# Patient Record
Sex: Male | Born: 1990 | Race: Black or African American | Hispanic: No | Marital: Single | Smoking: Current every day smoker
Health system: Southern US, Community
[De-identification: ages and names within clinical notes are randomized; demographics above are authoritative.]

## PROBLEM LIST (undated history)

## (undated) DIAGNOSIS — M1990 Unspecified osteoarthritis, unspecified site: Secondary | ICD-10-CM

## (undated) DIAGNOSIS — A64 Unspecified sexually transmitted disease: Secondary | ICD-10-CM

---
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
## 2010-08-04 ENCOUNTER — Emergency Department (HOSPITAL_BASED_OUTPATIENT_CLINIC_OR_DEPARTMENT_OTHER)
Admission: EM | Admit: 2010-08-04 | Discharge: 2010-08-04 | Payer: Self-pay | Source: Home / Self Care | Admitting: Emergency Medicine

## 2011-05-04 LAB — CBC
Hemoglobin: 14.2
RDW: 12.6
WBC: 14.3 — ABNORMAL HIGH

## 2011-05-04 LAB — DIFFERENTIAL
Basophils Absolute: 0
Basophils Relative: 0
Eosinophils Relative: 0
Lymphocytes Relative: 22 — ABNORMAL LOW
Lymphs Abs: 3.1
Monocytes Relative: 9
Neutro Abs: 9.8 — ABNORMAL HIGH
Neutrophils Relative %: 69

## 2011-05-04 LAB — MONONUCLEOSIS SCREEN: Mono Screen: POSITIVE — AB

## 2012-05-08 ENCOUNTER — Emergency Department (HOSPITAL_COMMUNITY)
Admission: EM | Admit: 2012-05-08 | Discharge: 2012-05-09 | Disposition: A | Discharge status: Home / Self Care | Payer: Self-pay | Attending: Emergency Medicine | Admitting: Emergency Medicine

## 2012-05-08 ENCOUNTER — Encounter (HOSPITAL_COMMUNITY): Payer: Self-pay | Admitting: Emergency Medicine

## 2012-05-08 DIAGNOSIS — W57XXXA Bitten or stung by nonvenomous insect and other nonvenomous arthropods, initial encounter: Secondary | ICD-10-CM | POA: Insufficient documentation

## 2012-05-08 DIAGNOSIS — S1096XA Insect bite of unspecified part of neck, initial encounter: Secondary | ICD-10-CM | POA: Insufficient documentation

## 2012-05-08 DIAGNOSIS — Y929 Unspecified place or not applicable: Secondary | ICD-10-CM | POA: Insufficient documentation

## 2012-05-08 NOTE — ED Notes (Signed)
Pt reports got bit by mosquito 2 days ago to R head, no having swelling to R head and pain

## 2012-05-09 MED ORDER — CEPHALEXIN 500 MG PO CAPS: 500.0000 mg | ORAL_CAPSULE | Freq: Four times a day (QID) | ORAL | Status: DC

## 2012-05-09 MED ORDER — IBUPROFEN 600 MG PO TABS: 600.0000 mg | ORAL_TABLET | Freq: Four times a day (QID) | ORAL | Status: DC | PRN

## 2012-05-09 NOTE — ED Provider Notes (Signed)
History     CSN: [id number]  Arrival date & time 05/08/12  2200   First MD Initiated Contact with Patient 05/08/12 2320      Chief Complaint  Patient presents with  . Insect Bite    (Consider location/radiation/quality/duration/timing/severity/associated sxs/prior treatment) HPI  21 year old male presents for evaluation of an insect bite.  Pt report he was bit by an insect 2 days ago on R side of face near R ear.  He felt the bite and did swat at the insect.  He then notice gradual swelling and sore sensation to affected area.  Onset acute, gradual in intensity.  Denies fever, chills, hearing changes, itch, bleeding or rash.  Has not tried any treatment yet.  No hx of allergy.     History reviewed. No pertinent past medical history.  History reviewed. No pertinent past surgical history.  History reviewed. No pertinent family history.  History  Substance Use Topics  . Smoking status: Current Every Day Smoker -- 0.5 packs/day    Types: Cigarettes  . Smokeless tobacco: Not on file  . Alcohol Use: Yes     occasion      Review of Systems  Constitutional: Negative for fever.  HENT: Negative for ear pain, facial swelling, neck pain and ear discharge.   Eyes: Negative for pain.  Skin: Negative for rash.  Neurological: Negative for numbness.    Allergies  Review of patient's allergies indicates no known allergies.  Home Medications  No current outpatient prescriptions on file.  BP 122/73  Pulse 92  Temp 98.5 F (36.9 C) (Oral)  Resp 20  SpO2 98%  Physical Exam  Nursing note and vitals reviewed. Constitutional: He appears well-nourished. No distress.  HENT:  Head: Normocephalic and atraumatic.  Right Ear: External ear normal.  Left Ear: External ear normal.  Nose: Nose normal.  Mouth/Throat: Oropharynx is clear and moist.       A localized skin irritation noted, with mild induration, no fluctuance.  No obvious cellulitis.  ttp  Preauricular lymphadenopathy  noted, ttp.  Eyes: Conjunctivae normal are normal.  Neck: Normal range of motion. Neck supple.  Lymphadenopathy:    He has no cervical adenopathy.  Neurological: He is alert.  Skin: Skin is warm. No rash noted.  Psychiatric: He has a normal mood and affect.    ED Course  Procedures (including critical care time)  Labs Reviewed - No data to display No results found.   No diagnosis found.  1. Localized skin reaction to insect bite  MDM  Pt with localized reaction to insect bite to R side of face anterior to R ear.  Has 1 reactive preauricular lymph node.  Area not amendable to I&D.  Pt request abx.  Will prescribe keflex for simple skin infection with recommendation for wait and treat if redness progress.  Ibuprofen for pain.  Pt voice understanding and agrees with plan.    BP 122/73  Pulse 92  Temp 98.5 F (36.9 C) (Oral)  Resp 20  SpO2 98%  Nursing notes reviewed and considered in documentation        Fayrene Helper, PA-C 05/09/12 0046

## 2012-05-09 NOTE — ED Provider Notes (Signed)
Medical screening examination/treatment/procedure(s) were performed by non-physician practitioner and as supervising physician I was immediately available for consultation/collaboration.    Vida Roller, MD 05/09/12 [phone]

## 2012-05-09 NOTE — Discharge Instructions (Signed)
You may take antibiotic if you notices increased redness and swelling to the affected area.  You may apply neosporin to wound and apply warm compress twice daily as needed to help decrease risk of forming an abscess.  Return if your symptoms worsen or if you have other concerns.   Insect Bite Mosquitoes, flies, fleas, bedbugs, and many other insects can bite. Insect bites are different from insect stings. A sting is when venom is injected into the skin. Some insect bites can transmit infectious diseases. SYMPTOMS  Insect bites usually turn red, swell, and itch for 2 to 4 days. They often go away on their own. TREATMENT  Your caregiver may prescribe antibiotic medicines if a bacterial infection develops in the bite. HOME CARE INSTRUCTIONS  Do not scratch the bite area.  Keep the bite area clean and dry. Wash the bite area thoroughly with soap and water.  Put ice or cool compresses on the bite area.  Put ice in a plastic bag.  Place a towel between your skin and the bag.  Leave the ice on for 20 minutes, 4 times a day for the first 2 to 3 days, or as directed.  You may apply a baking soda paste, cortisone cream, or calamine lotion to the bite area as directed by your caregiver. This can help reduce itching and swelling.  Only take over-the-counter or prescription medicines as directed by your caregiver.  If you are given antibiotics, take them as directed. Finish them even if you start to feel better. You may need a tetanus shot if:  You cannot remember when you had your last tetanus shot.  You have never had a tetanus shot.  The injury broke your skin. If you get a tetanus shot, your arm may swell, get red, and feel warm to the touch. This is common and not a problem. If you need a tetanus shot and you choose not to have one, there is a rare chance of getting tetanus. Sickness from tetanus can be serious. SEEK IMMEDIATE MEDICAL CARE IF:   You have increased pain, redness, or  swelling in the bite area.  You see a red line on the skin coming from the bite.  You have a fever.  You have joint pain.  You have a headache or neck pain.  You have unusual weakness.  You have a rash.  You have chest pain or shortness of breath.  You have abdominal pain, nausea, or vomiting.  You feel unusually tired or sleepy. MAKE SURE YOU:   Understand these instructions.  Will watch your condition.  Will get help right away if you are not doing well or get worse. Document Released: 08/31/2004 Document Revised: 10/16/2011 Document Reviewed: 02/22/2011 [facility] Patient Information 2013 [place], [place].

## 2013-12-03 ENCOUNTER — Encounter (HOSPITAL_COMMUNITY): Payer: Self-pay | Admitting: Emergency Medicine

## 2013-12-03 ENCOUNTER — Emergency Department (HOSPITAL_COMMUNITY)
Admission: EM | Admit: 2013-12-03 | Discharge: 2013-12-03 | Disposition: A | Discharge status: Home / Self Care | Payer: Self-pay | Attending: Emergency Medicine | Admitting: Emergency Medicine

## 2013-12-03 DIAGNOSIS — Z202 Contact with and (suspected) exposure to infections with a predominantly sexual mode of transmission: Secondary | ICD-10-CM | POA: Insufficient documentation

## 2013-12-03 DIAGNOSIS — Z791 Long term (current) use of non-steroidal anti-inflammatories (NSAID): Secondary | ICD-10-CM | POA: Insufficient documentation

## 2013-12-03 DIAGNOSIS — R300 Dysuria: Secondary | ICD-10-CM | POA: Insufficient documentation

## 2013-12-03 DIAGNOSIS — R369 Urethral discharge, unspecified: Secondary | ICD-10-CM | POA: Insufficient documentation

## 2013-12-03 DIAGNOSIS — F17200 Nicotine dependence, unspecified, uncomplicated: Secondary | ICD-10-CM | POA: Insufficient documentation

## 2013-12-03 DIAGNOSIS — Z792 Long term (current) use of antibiotics: Secondary | ICD-10-CM | POA: Insufficient documentation

## 2013-12-03 LAB — RPR

## 2013-12-03 LAB — HIV ANTIBODY (ROUTINE TESTING W REFLEX): HIV: NONREACTIVE

## 2013-12-03 MED ORDER — AZITHROMYCIN 250 MG PO TABS
1000.0000 mg | ORAL_TABLET | Freq: Once | ORAL | Status: AC
  Administered 2013-12-03: 1000 mg via ORAL
  Filled 2013-12-03: qty 4

## 2013-12-03 MED ORDER — CEFTRIAXONE SODIUM 250 MG IJ SOLR
250.0000 mg | Freq: Once | INTRAMUSCULAR | Status: AC
  Administered 2013-12-03: 250 mg via INTRAMUSCULAR
  Filled 2013-12-03: qty 250

## 2013-12-03 MED ORDER — LIDOCAINE HCL (PF) 1 % IJ SOLN
INTRAMUSCULAR | Status: AC
  Administered 2013-12-03: 1 mL
  Filled 2013-12-03: qty 5

## 2013-12-03 NOTE — ED Provider Notes (Signed)
CSN: [id number]     Arrival date & time 12/03/13  1601 History  This chart was scribed for non-physician practitioner Dierdre ForthHannah Evann Erazo, PA-C with Ethelda ChickMartha K Linker, MD by Dorothey Basemania Sutton, ED Scribe. This patient was seen in room TR10C/TR10C and the patient's care was started at 4:49 PM.    Chief Complaint  Patient presents with  . SEXUALLY TRANSMITTED DISEASE   The history is provided by the patient and medical records. No language interpreter was used.   HPI Comments: Donald Michael is a 23 y.o. male who presents to the Emergency Department complaining of penile discharge with associated burning dysuria. Patient reports that his partner was recently diagnosed with chlamydia and gonorrhea. Patient reports that he was treated for both of these diseases prior to his partner's diagnosis.  He denies testicular pain or scrotal swelling, fever, chills, nausea, vomiting, abdominal pain, genital sores. He denies any allergies to medications. Patient has no other pertinent medical history.   History reviewed. No pertinent past medical history. History reviewed. No pertinent past surgical history. History reviewed. No pertinent family history. History  Substance Use Topics  . Smoking status: Current Every Day Smoker -- 0.50 packs/day    Types: Cigarettes  . Smokeless tobacco: Not on file  . Alcohol Use: Yes     Comment: occasion    Review of Systems  Constitutional: Negative for fever and chills.  Gastrointestinal: Negative for nausea, vomiting and abdominal pain.  Genitourinary: Positive for dysuria and discharge. Negative for scrotal swelling, genital sores and testicular pain.  All other systems reviewed and are negative.     Allergies  Review of patient's allergies indicates no known allergies.  Home Medications   Prior to Admission medications   Medication Sig Start Date End Date Taking? Authorizing Provider  cephALEXin (KEFLEX) 500 MG capsule Take 1 capsule (500 mg total) by mouth 4  (four) times daily. 05/09/12   Fayrene HelperBowie Tran, PA-C  ibuprofen (ADVIL,MOTRIN) 600 MG tablet Take 1 tablet (600 mg total) by mouth every 6 (six) hours as needed for pain. 05/09/12   Fayrene HelperBowie Tran, PA-C   Triage Vitals: BP 118/68  Pulse 76  Temp(Src) 98.7 F (37.1 C) (Oral)  Resp 18  SpO2 100%  Physical Exam  Nursing note and vitals reviewed. Constitutional: He appears well-developed and well-nourished. No distress.  Awake, alert, nontoxic appearance  HENT:  Head: Normocephalic and atraumatic.  Mouth/Throat: Oropharynx is clear and moist. No oropharyngeal exudate.  Eyes: Conjunctivae are normal. No scleral icterus.  Neck: Normal range of motion. Neck supple.  Cardiovascular: Normal rate, regular rhythm, normal heart sounds and intact distal pulses.   No murmur heard. Pulmonary/Chest: Effort normal and breath sounds normal. No respiratory distress. He has no wheezes.  Abdominal: Soft. Bowel sounds are normal. He exhibits no distension and no mass. There is no tenderness. There is no rebound and no guarding. Hernia confirmed negative in the right inguinal area and confirmed negative in the left inguinal area.  Genitourinary: Testes normal and penis normal. Cremasteric reflex is present. Right testis shows no mass, no swelling and no tenderness. Right testis is descended. Left testis shows no mass, no swelling and no tenderness. Left testis is descended. Circumcised. No phimosis, paraphimosis, hypospadias, penile erythema or penile tenderness. No discharge found.  Chaperone (scribe) was present for exam which was performed with no discomfort or complications.    Musculoskeletal: Normal range of motion. He exhibits no edema.  Lymphadenopathy:       Right: No inguinal adenopathy present.  Neurological: He is alert.  Speech is clear and goal oriented Moves extremities without ataxia  Skin: Skin is warm and dry. He is not diaphoretic.  Psychiatric: He has a normal mood and affect.    ED Course   Procedures (including critical care time)  DIAGNOSTIC STUDIES: Oxygen Saturation is 100% on room air, normal by my interpretation.    COORDINATION OF CARE: 4:52 PM- Performed STD testing. Will go ahead and treat for gonorrhea and chlamydia. Discussed that he will be notified of his test results in a few days. Discussed treatment plan with patient at bedside and patient verbalized agreement.     Labs Review Labs Reviewed  GC/CHLAMYDIA PROBE AMP  RPR  HIV ANTIBODY (ROUTINE TESTING)    Imaging Review No results found.   EKG Interpretation None      MDM   Final diagnoses:  Exposure to STD   Donald Michael presents after exposure to both Gonorrhea and Chlamydia from a male partner.  STD cultures obtained including gonorrhea, chlamydia, HIV and Syphilis. Patient to be discharged with instructions to follow up with PCP. Discussed importance of using protection when sexually active. Pt understands that they have GC/Chlamydia cultures pending and that they will need to inform all sexual partners if results return positive. Pt treated prophylactically with Azithromycin and Rocephin. I have also discussed reasons to return immediately to the ER including pain in the testicles, difficulty urinating or high fevers. Patient expresses understanding and agrees with plan.  It has been determined that no acute conditions requiring further emergency intervention are present at this time. The patient/guardian have been advised of the diagnosis and plan. We have discussed signs and symptoms that warrant return to the ED, such as changes or worsening in symptoms.   Vital signs are stable at discharge.   BP 118/68  Pulse 76  Temp(Src) 98.7 F (37.1 C) (Oral)  Resp 18  SpO2 100%  Patient/guardian has voiced understanding and agreed to follow-up with the PCP or specialist.    I personally performed the services described in this documentation, which was scribed in my presence. The  recorded information has been reviewed and is accurate.     Dahlia ClientHannah Krishauna Schatzman, PA-C 12/03/13 1704

## 2013-12-03 NOTE — Discharge Instructions (Signed)
1. Medications: usual home medications 2. Treatment: rest, drink plenty of fluids, no sexual intercourse for 7 days after treatmenr 3. Follow Up: Please followup with your primary doctor for discussion of your diagnoses and further evaluation after today's visit; if you do not have a primary care doctor use the resource guide provided to find one;    You have been treated in the emergency department for an infection, possibly sexually transmitted. Results of your gonorrhea and chlamydia tests are pending and you will be notified if they are positive. It is very important to practice safe sex and use condoms when sexually active. If your results are positive you need to notify all sexual partners so they can be treated as well. The website [email or web address].[email or web address]/ can be used to send anonymous text messages or emails to alert sexual contacts. Follow up with your doctor, or OBGYN in regards to today's visit.    Gonorrhea and Chlamydia SYMPTOMS  In females, symptoms may go unnoticed. Symptoms that are more noticeable can include:  Belly (abdominal) pain.  Painful intercourse.  Watery mucous-like discharge from the vagina.  Miscarriage.  Discomfort when urinating.  Inflammation of the rectum.  Abnormal gray-green frothy vaginal discharge  Vaginal itching and irritatio  Itching and irritation of the area outside the vagina.   Painful urination.  Bleeding after sexual intercourse.  In males, symptoms include:  Burning with urination.  Pain in the testicles.  Watery mucous-like discharge from the penis.  It can cause longstanding (chronic) pelvic pain after frequent infections.  TREATMENT  PID can cause women to not be able to have children (sterile) if left untreated or if half-treated.  It is important to finish ALL medications given to you.  This is a sexually transmitted infection. So you are also at risk for other sexually transmitted diseases, including HIV (AIDS), it is recommended  that you get tested. HOME CARE INSTRUCTIONS  Warning: This infection is contagious. Do not have sex until treatment is completed. Follow up at your caregiver's office or the clinic to which you were referred. If your diagnosis (learning what is wrong) is confirmed by culture or some other method, your recent sexual contacts need treatment. Even if they are symptom free or have a negative culture or evaluation, they should be treated.  PREVENTION  Women should use sanitary pads instead of tampons for vaginal discharge.  Wipe front to back after using the toilet and avoid douching.   Practice safe sex, use condoms, have only one sex partner and be sure your sex partner is not having sex with others.  Ask your caregiver to test you for chlamydia at your regular checkups or sooner if you are having symptoms.  Ask for further information if you are pregnant.  SEEK IMMEDIATE MEDICAL CARE IF:  You develop an oral temperature above 102 F (38.9 C), not controlled by medications or lasting more than 2 days.  You develop an increase in pain.  You develop any type of abnormal discharge.  You develop vaginal bleeding and it is not time for your period.  You develop painful intercourse.   Bacterial Vaginosis  Bacterial vaginosis (BV) is a vaginal infection where the normal balance of bacteria in the vagina is disrupted. This is not a sexually transmitted disease and your sexual partners do NOT need to be treated. CAUSES  The cause of BV is not fully understood. BV develops when there is an increase or imbalance of harmful bacteria.  Some activities or behaviors can upset  the normal balance of bacteria in the vagina and put women at increased risk including:  Having a new sex partner or multiple sex partners.  Douching.  Using an intrauterine device (IUD) for contraception.  It is not clear what role sexual activity plays in the development of BV. However, women that have never had sexual intercourse are  rarely infected with BV.  Women do not get BV from toilet seats, bedding, swimming pools or from touching objects around them.   SYMPTOMS  Grey vaginal discharge.  A fish-like odor with discharge, especially after sexual intercourse.  Itching or burning of the vagina and vulva.  Burning or pain with urination.  Some women have no signs or symptoms at all.   TREATMENT  Sometimes BV will clear up without treatment.  BV may be treated with antibiotics.  BV can recur after treatment. If this happens, a second round of antibiotics will often be prescribed.  HOME CARE INSTRUCTIONS  Finish all medication as directed by your caregiver.  Do not have sex until treatment is completed.  Do NOT drink any alcoholic beverages while being treated  with Metronidazole (Flagyl). This will cause a severe reaction inducing vomiting.  RESOURCE GUIDE  Dental Problems  Patients with Medicaid: [facility]                     [facility] [phone]                                           [phone] Phone:  [phone]                                                  Phone:  [phone]  If unable to pay or uninsured, contact:  [facility] or [facility] to become qualified for the adult dental clinic.  Chronic Pain Problems Contact [facility]  [phone] Patients need to be referred by their primary care doctor.  Insufficient Money for Medicine Contact United Way:  call "211" or [facility] [phone].  No Primary Care Doctor Call [facility]  [phone] Other agencies that provide inexpensive medical care    [facility]  [phone]    [facility]  [phone]    [facility]  [phone]    [facility]  [phone]    [facility]  [phone]    [facility]  [phone]  Psychological Services [facility]  [phone] [facility]  [phone] [facility]  [facility]   [phone] (emergency services [phone])  Substance Abuse Resources [facility]  [phone] [facility] [phone] [facility] [phone] [facility] [phone] Residential & Outpatient Substance Abuse Program  [phone]  Abuse/Neglect [facility] Child [facility] Hotline [phone] [facility] Hotline [phone] (After Hours)  Emergency Shelter [facility] [phone](336) [phone]  [facility] [phone]) [phone] [facility] [phone]  MRSA Hotline #:   604-705-0454815-617-7571    [facility]  [facility]     [employer]  [facility] °[address] [place]                       [address]      [address]  °[place]                                                [place]                            [place] °Phone:  [phone]                                   Phone:  [phone]                 Phone:  [phone] ° °[facility] °Phone:  342-8316 ° °[place] Child Abuse Hotline °(336) 342-1394 °([phone]) [phone] (After Hours) ° ° ° ° °

## 2013-12-03 NOTE — ED Notes (Signed)
Pt presents to department for evaluation of possible STD. Pt states penile discharge and pain with urination. States partner was exposed and wants to be tested. No signs of distress noted.

## 2013-12-03 NOTE — ED Provider Notes (Signed)
Medical screening examination/treatment/procedure(s) were performed by non-physician practitioner and as supervising physician I was immediately available for consultation/collaboration.   EKG Interpretation None       Ethelda ChickMartha K Linker, MD 12/03/13 1705

## 2013-12-04 LAB — GC/CHLAMYDIA PROBE AMP
CT PROBE, AMP APTIMA: POSITIVE — AB
GC PROBE AMP APTIMA: POSITIVE — AB

## 2013-12-05 ENCOUNTER — Telehealth (HOSPITAL_BASED_OUTPATIENT_CLINIC_OR_DEPARTMENT_OTHER): Payer: Self-pay | Admitting: Emergency Medicine

## 2013-12-05 NOTE — Telephone Encounter (Addendum)
+  Gonorrhea. +Chlamydia. Patient treated with Rocephin and Zithromax. [employer] faxed.

## 2013-12-05 NOTE — Telephone Encounter (Signed)
Patient notified of +Gonorrhea and +Chlamydia and appropriate treatment.

## 2014-03-15 ENCOUNTER — Emergency Department (HOSPITAL_COMMUNITY)
Admission: EM | Admit: 2014-03-15 | Discharge: 2014-03-15 | Disposition: A | Discharge status: Home / Self Care | Payer: Self-pay | Attending: Emergency Medicine | Admitting: Emergency Medicine

## 2014-03-15 ENCOUNTER — Encounter (HOSPITAL_COMMUNITY): Payer: Self-pay | Admitting: Emergency Medicine

## 2014-03-15 DIAGNOSIS — F17200 Nicotine dependence, unspecified, uncomplicated: Secondary | ICD-10-CM | POA: Insufficient documentation

## 2014-03-15 DIAGNOSIS — Z202 Contact with and (suspected) exposure to infections with a predominantly sexual mode of transmission: Secondary | ICD-10-CM | POA: Insufficient documentation

## 2014-03-15 DIAGNOSIS — Z8619 Personal history of other infectious and parasitic diseases: Secondary | ICD-10-CM | POA: Insufficient documentation

## 2014-03-15 DIAGNOSIS — R369 Urethral discharge, unspecified: Secondary | ICD-10-CM | POA: Insufficient documentation

## 2014-03-15 HISTORY — DX: Unspecified sexually transmitted disease: A64

## 2014-03-15 MED ORDER — AZITHROMYCIN 250 MG PO TABS
1000.0000 mg | ORAL_TABLET | Freq: Once | ORAL | Status: AC
  Administered 2014-03-15: 1000 mg via ORAL
  Filled 2014-03-15: qty 4

## 2014-03-15 MED ORDER — CEFTRIAXONE SODIUM 250 MG IJ SOLR
250.0000 mg | Freq: Once | INTRAMUSCULAR | Status: AC
  Administered 2014-03-15: 250 mg via INTRAMUSCULAR
  Filled 2014-03-15: qty 250

## 2014-03-15 MED ORDER — LIDOCAINE HCL (PF) 1 % IJ SOLN
5.0000 mL | Freq: Once | INTRAMUSCULAR | Status: AC
  Administered 2014-03-15: 5 mL
  Filled 2014-03-15: qty 5

## 2014-03-15 NOTE — Discharge Instructions (Signed)
Refrain from sexual intercourse for 7 days. Be sure to have all partners tested and treated for STDs. You may also be tested and treated by a primary care provider as well as the Health Department. Practice safe sex by always wearing condoms.

## 2014-03-15 NOTE — ED Notes (Signed)
Declined W/C at D/C and was escorted to lobby by RN. 

## 2014-03-15 NOTE — ED Provider Notes (Signed)
CSN: [id number]     Arrival date & time 03/15/14  0912 History  This chart was scribed for Junius FinnerErin O'Malley, PA-C, working with Raeford RazorStephen Kohut, MD by Chestine SporeSoijett Blue, ED Scribe. The patient was seen in room TR06C/TR06C at 9:28 AM.     Chief Complaint  Patient presents with  . Exposure to STD     The history is provided by the patient. No language interpreter was used.   HPI Comments: Donald Michael is a 23 y.o. male who presents to the Emergency Department complaining of an exposure to STD. He states that his "baby m[facility]" [facility] and was dx with gonorrhea and trichomoniasis. He states that he is having associated symptoms of tingling, genital pain, and penile discharge. He denies nausea, vomiting, fever, abdominal pain, rashes, and any other associated symptoms. He denies being allergic to any medications.   Past Medical History  Diagnosis Date  . STD (male)    History reviewed. No pertinent past surgical history. History reviewed. No pertinent family history. History  Substance Use Topics  . Smoking status: Current Every Day Smoker -- 0.50 packs/day    Types: Cigarettes  . Smokeless tobacco: Not on file  . Alcohol Use: Yes     Comment: occasion    Review of Systems  Constitutional: Negative for fever.  Gastrointestinal: Negative for nausea, vomiting and abdominal distention.  Genitourinary: Positive for discharge and penile pain.       Tingling feeling in penis  Skin: Negative for rash.  All other systems reviewed and are negative.     Allergies  Review of patient's allergies indicates no known allergies.  Home Medications   Prior to Admission medications   Not on File   BP 110/66  Pulse 58  Temp(Src) 98.4 F (36.9 C) (Oral)  Resp 18  Ht 6\' 4"  (1.93 m)  Wt 176 lb (79.833 kg)  BMI 21.43 kg/m2  SpO2 100%  Physical Exam  Nursing note and vitals reviewed. Constitutional: He is oriented to person, place, and time. He appears well-developed and  well-nourished.  HENT:  Head: Normocephalic and atraumatic.  Eyes: EOM are normal.  Neck: Normal range of motion.  Cardiovascular: Normal rate.   Pulmonary/Chest: Effort normal.  Abdominal: Soft. He exhibits no distension. There is no tenderness.  Genitourinary:  Chaperone during exam. No penile discharge, rash, or erythema. No scrotal swelling.   Musculoskeletal: Normal range of motion.  Neurological: He is alert and oriented to person, place, and time.  Skin: Skin is warm and dry.  Psychiatric: He has a normal mood and affect. His behavior is normal.    ED Course  Procedures (including critical care time) DIAGNOSTIC STUDIES: Oxygen Saturation is 100% on room air, normal by my interpretation.    COORDINATION OF CARE: 9:31 AM-Discussed treatment plan which includes Zithromax, Rocephin, and GC/Chlamydia probe labs with pt at bedside and pt agreed to plan.   Labs Review Labs Reviewed  GC/CHLAMYDIA PROBE AMP    Imaging Review No results found.   EKG Interpretation None      MDM   Final diagnoses:  Exposure to STD  Penile discharge    Pt presenting to ED with exposure to STD with associated penile discharge and pain. No rash or lesion. No fever, n/v/d. GC/Chlamydia swab sent. Pt tx empirically with azithromycin and rocephin.  Medical records reviewed, negative HIV and syphilis labs in 11/2013.  Did not repeat today. Advised to f/u with Health department in 7 days for recheck  as well as have his partner properly treated and rechecked. Encouraged use of condoms. Pt education packet sent home with pt. Pt verbalized understanding and agreement with tx plan.   I personally performed the services described in this documentation, which was scribed in my presence. The recorded information has been reviewed and is accurate.    Junius Finner, PA-C 03/15/14 1015

## 2014-03-15 NOTE — ED Notes (Signed)
Pt reports partner has a DX of STD and is here for treatment.

## 2014-03-17 LAB — GC/CHLAMYDIA PROBE AMP
CT Probe RNA: NEGATIVE
GC Probe RNA: POSITIVE — AB

## 2014-03-18 ENCOUNTER — Telehealth (HOSPITAL_BASED_OUTPATIENT_CLINIC_OR_DEPARTMENT_OTHER): Payer: Self-pay | Admitting: Emergency Medicine

## 2014-03-18 NOTE — Telephone Encounter (Signed)
Post ED Visit - Positive Culture Follow-up  Culture report reviewed by antimicrobial stewardship pharmacist: []  Wes Dulaney, Pharm.D., BCPS []  Celedonio MiyamotoJeremy Frens, Pharm.D., BCPS []  Georgina PillionElizabeth Martin, Pharm.D., BCPS []  [place] City[place]h Pham, Vermont[place]arm.D., BCPS, AAHIVP []  Estella HuskMichelle Turner, Pharm.D., BCPS, AAHIVP []  Red ChristiansSamson Lee, Pharm.D. []  Tennis Mustassie Stewart, Pharm.D.  Positive gonorrhea culture Treated with zithromax 1000mg  po, rocephin 250mg  IM , organism sensitive to the same and no further patient follow-up is required at this time.  Berle MullMiller, Dayveon Halley 03/18/2014, 11:35 AM

## 2014-03-19 ENCOUNTER — Telehealth (HOSPITAL_BASED_OUTPATIENT_CLINIC_OR_DEPARTMENT_OTHER): Payer: Self-pay | Admitting: Emergency Medicine

## 2014-03-19 NOTE — ED Provider Notes (Signed)
Medical screening examination/treatment/procedure(s) were performed by non-physician practitioner and as supervising physician I was immediately available for consultation/collaboration.   EKG Interpretation None       Aesha Agrawal, MD 03/19/14 0627 

## 2014-03-20 ENCOUNTER — Telehealth (HOSPITAL_BASED_OUTPATIENT_CLINIC_OR_DEPARTMENT_OTHER): Payer: Self-pay | Admitting: Emergency Medicine

## 2014-05-13 ENCOUNTER — Encounter (HOSPITAL_COMMUNITY): Payer: Self-pay | Admitting: Emergency Medicine

## 2014-05-13 ENCOUNTER — Emergency Department (HOSPITAL_COMMUNITY): Payer: Self-pay

## 2014-05-13 ENCOUNTER — Emergency Department (HOSPITAL_COMMUNITY)
Admission: EM | Admit: 2014-05-13 | Discharge: 2014-05-13 | Disposition: A | Discharge status: Home / Self Care | Payer: Self-pay | Attending: Emergency Medicine | Admitting: Emergency Medicine

## 2014-05-13 DIAGNOSIS — R000 Tachycardia, unspecified: Secondary | ICD-10-CM | POA: Insufficient documentation

## 2014-05-13 DIAGNOSIS — S0101XA Laceration without foreign body of scalp, initial encounter: Secondary | ICD-10-CM | POA: Insufficient documentation

## 2014-05-13 DIAGNOSIS — Z23 Encounter for immunization: Secondary | ICD-10-CM | POA: Insufficient documentation

## 2014-05-13 DIAGNOSIS — Z8619 Personal history of other infectious and parasitic diseases: Secondary | ICD-10-CM | POA: Insufficient documentation

## 2014-05-13 DIAGNOSIS — Z720 Tobacco use: Secondary | ICD-10-CM | POA: Insufficient documentation

## 2014-05-13 MED ORDER — HYDROCODONE-ACETAMINOPHEN 5-325 MG PO TABS
2.0000 | ORAL_TABLET | Freq: Once | ORAL | Status: AC
  Administered 2014-05-13: 2 via ORAL
  Filled 2014-05-13: qty 2

## 2014-05-13 MED ORDER — HYDROCODONE-ACETAMINOPHEN 5-325 MG PO TABS: 1.0000 | ORAL_TABLET | Freq: Four times a day (QID) | ORAL | Status: AC | PRN

## 2014-05-13 MED ORDER — TETANUS-DIPHTH-ACELL PERTUSSIS 5-2.5-18.5 LF-MCG/0.5 IM SUSP
0.5000 mL | Freq: Once | INTRAMUSCULAR | Status: AC
  Administered 2014-05-13: 0.5 mL via INTRAMUSCULAR
  Filled 2014-05-13: qty 0.5

## 2014-05-13 NOTE — ED Notes (Signed)
Dr.Walden at the bedside.  

## 2014-05-13 NOTE — ED Notes (Signed)
Patient transported to CT 
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
## 2014-05-13 NOTE — Discharge Instructions (Signed)
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
## 2014-05-13 NOTE — ED Notes (Signed)
CSI at the bedside 

## 2014-09-15 ENCOUNTER — Emergency Department (HOSPITAL_COMMUNITY)
Admission: EM | Admit: 2014-09-15 | Discharge: 2014-09-15 | Disposition: A | Discharge status: Home / Self Care | Payer: Self-pay | Attending: Emergency Medicine | Admitting: Emergency Medicine

## 2014-09-15 ENCOUNTER — Encounter (HOSPITAL_COMMUNITY): Payer: Self-pay

## 2014-09-15 DIAGNOSIS — J9801 Acute bronchospasm: Secondary | ICD-10-CM

## 2014-09-15 DIAGNOSIS — Z8619 Personal history of other infectious and parasitic diseases: Secondary | ICD-10-CM | POA: Insufficient documentation

## 2014-09-15 DIAGNOSIS — M1990 Unspecified osteoarthritis, unspecified site: Secondary | ICD-10-CM | POA: Insufficient documentation

## 2014-09-15 DIAGNOSIS — J45901 Unspecified asthma with (acute) exacerbation: Secondary | ICD-10-CM | POA: Insufficient documentation

## 2014-09-15 DIAGNOSIS — Z720 Tobacco use: Secondary | ICD-10-CM | POA: Insufficient documentation

## 2014-09-15 HISTORY — DX: Unspecified osteoarthritis, unspecified site: M19.90

## 2014-09-15 MED ORDER — ALBUTEROL SULFATE HFA 108 (90 BASE) MCG/ACT IN AERS
2.0000 | INHALATION_SPRAY | Freq: Once | RESPIRATORY_TRACT | Status: AC
  Administered 2014-09-15: 2 via RESPIRATORY_TRACT
  Filled 2014-09-15: qty 6.7
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
## 2014-09-15 NOTE — Discharge Instructions (Signed)
Use the albuterol inhaler every 4-6 hours as needed for wheezing.  Bronchospasm A bronchospasm is a spasm or tightening of the airways going into the lungs. During a bronchospasm breathing becomes more difficult because the airways get smaller. When this happens there can be coughing, a whistling sound when breathing (wheezing), and difficulty breathing. Bronchospasm is often associated with asthma, but not all patients who experience a bronchospasm have asthma. CAUSES  A bronchospasm is caused by inflammation or irritation of the airways. The inflammation or irritation may be triggered by:   Allergies (such as to animals, pollen, food, or mold). Allergens that cause bronchospasm may cause wheezing immediately after exposure or many hours later.   Infection. Viral infections are believed to be the most common cause of bronchospasm.   Exercise.   Irritants (such as pollution, cigarette smoke, strong odors, aerosol sprays, and paint fumes).   Weather changes. Winds increase molds and pollens in the air. Rain refreshes the air by washing irritants out. Cold air may cause inflammation.   Stress and emotional upset.  SIGNS AND SYMPTOMS   Wheezing.   Excessive nighttime coughing.   Frequent or severe coughing with a simple cold.   Chest tightness.   Shortness of breath.  DIAGNOSIS  Bronchospasm is usually diagnosed through a history and physical exam. Tests, such as chest X-rays, are sometimes done to look for other conditions. TREATMENT   Inhaled medicines can be given to open up your airways and help you breathe. The medicines can be given using either an inhaler or a nebulizer machine.  Corticosteroid medicines may be given for severe bronchospasm, usually when it is associated with asthma. HOME CARE INSTRUCTIONS   Always have a plan prepared for seeking medical care. Know when to call your health care provider and local emergency services (911 in the [place] Know where  you can access local emergency care.  Only take medicines as directed by your health care provider.  If you were prescribed an inhaler or nebulizer machine, ask your health care provider to explain how to use it correctly. Always use a spacer with your inhaler if you were given one.  It is necessary to remain calm during an attack. Try to relax and breathe more slowly.  Control your home environment in the following ways:   Change your heating and air conditioning filter at least once a month.   Limit your use of fireplaces and wood stoves.  Do not smoke and do not allow smoking in your home.   Avoid exposure to perfumes and fragrances.   Get rid of pests (such as roaches and mice) and their droppings.   Throw away plants if you see mold on them.   Keep your house clean and dust free.   Replace carpet with wood, tile, or vinyl flooring. Carpet can trap dander and dust.   Use allergy-proof pillows, mattress covers, and box spring covers.   Wash bed sheets and blankets every week in hot water and dry them in a dryer.   Use blankets that are made of polyester or cotton.   Wash hands frequently. SEEK MEDICAL CARE IF:   You have muscle aches.   You have chest pain.   The sputum changes from clear or white to yellow, green, gray, or bloody.   The sputum you cough up gets thicker.   There are problems that may be related to the medicine you are given, such as a rash, itching, swelling, or trouble breathing.  SEEK IMMEDIATE  MEDICAL CARE IF:   You have worsening wheezing and coughing even after taking your prescribed medicines.   You have increased difficulty breathing.   You develop severe chest pain. MAKE SURE YOU:   Understand these instructions.  Will watch your condition.  Will get help right away if you are not doing well or get worse. Document Released: 07/27/2003 Document Revised: 07/29/2013 Document Reviewed: 01/13/2013 [facility] Patient  Information 2015 [place], [place]. This information is not intended to replace advice given to you by your health care provider. Make sure you discuss any questions you have with your health care provider.  Asthma Asthma is a recurring condition in which the airways tighten and narrow. Asthma can make it difficult to breathe. It can cause coughing, wheezing, and shortness of breath. Asthma episodes, also called asthma attacks, range from minor to life-threatening. Asthma cannot be cured, but medicines and lifestyle changes can help control it. CAUSES Asthma is believed to be caused by inherited (genetic) and environmental factors, but its exact cause is unknown. Asthma may be triggered by allergens, lung infections, or irritants in the air. Asthma triggers are different for each person. Common triggers include:   Animal dander.  Dust mites.  Cockroaches.  Pollen from trees or grass.  Mold.  Smoke.  Air pollutants such as dust, household cleaners, hair sprays, aerosol sprays, paint fumes, strong chemicals, or strong odors.  Cold air, weather changes, and winds (which increase molds and pollens in the air).  Strong emotional expressions such as crying or laughing hard.  Stress.  Certain medicines (such as aspirin) or types of drugs (such as beta-blockers).  Sulfites in foods and drinks. Foods and drinks that may contain sulfites include dried fruit, potato chips, and sparkling grape juice.  Infections or inflammatory conditions such as the flu, a cold, or an inflammation of the nasal membranes (rhinitis).  Gastroesophageal reflux disease (GERD).  Exercise or strenuous activity. SYMPTOMS Symptoms may occur immediately after asthma is triggered or many hours later. Symptoms include:  Wheezing.  Excessive nighttime or early morning coughing.  Frequent or severe coughing with a common cold.  Chest tightness.  Shortness of breath. DIAGNOSIS  The diagnosis of asthma is made by  a review of your medical history and a physical exam. Tests may also be performed. These may include:  Lung function studies. These tests show how much air you breathe in and out.  Allergy tests.  Imaging tests such as X-rays. TREATMENT  Asthma cannot be cured, but it can usually be controlled. Treatment involves identifying and avoiding your asthma triggers. It also involves medicines. There are 2 classes of medicine used for asthma treatment:   Controller medicines. These prevent asthma symptoms from occurring. They are usually taken every day.  Reliever or rescue medicines. These quickly relieve asthma symptoms. They are used as needed and provide short-term relief. Your health care provider will help you create an asthma action plan. An asthma action plan is a written plan for managing and treating your asthma attacks. It includes a list of your asthma triggers and how they may be avoided. It also includes information on when medicines should be taken and when their dosage should be changed. An action plan may also involve the use of a device called a peak flow meter. A peak flow meter measures how well the lungs are working. It helps you monitor your condition. HOME CARE INSTRUCTIONS   Take medicines only as directed by your health care provider. Speak with your health  care provider if you have questions about how or when to take the medicines.  Use a peak flow meter as directed by your health care provider. Record and keep track of readings.  Understand and use the action plan to help minimize or stop an asthma attack without needing to seek medical care.  Control your home environment in the following ways to help prevent asthma attacks:  Do not smoke. Avoid being exposed to secondhand smoke.  Change your heating and air conditioning filter regularly.  Limit your use of fireplaces and wood stoves.  Get rid of pests (such as roaches and mice) and their droppings.  Throw away  plants if you see mold on them.  Clean your floors and dust regularly. Use unscented cleaning products.  Try to have someone else vacuum for you regularly. Stay out of rooms while they are being vacuumed and for a short while afterward. If you vacuum, use a dust mask from a hardware store, a double-layered or microfilter vacuum cleaner bag, or a vacuum cleaner with a HEPA filter.  Replace carpet with wood, tile, or vinyl flooring. Carpet can trap dander and dust.  Use allergy-proof pillows, mattress covers, and box spring covers.  Wash bed sheets and blankets every week in hot water and dry them in a dryer.  Use blankets that are made of polyester or cotton.  Clean bathrooms and kitchens with bleach. If possible, have someone repaint the walls in these rooms with mold-resistant paint. Keep out of the rooms that are being cleaned and painted.  Wash hands frequently. SEEK MEDICAL CARE IF:   You have wheezing, shortness of breath, or a cough even if taking medicine to prevent attacks.  The colored mucus you cough up (sputum) is thicker than usual.  Your sputum changes from clear or white to yellow, green, gray, or bloody.  You have any problems that may be related to the medicines you are taking (such as a rash, itching, swelling, or trouble breathing).  You are using a reliever medicine more than 2-3 times per week.  Your peak flow is still at 50-79% of your personal best after following your action plan for 1 hour.  You have a fever. SEEK IMMEDIATE MEDICAL CARE IF:   You seem to be getting worse and are unresponsive to treatment during an asthma attack.  You are short of breath even at rest.  You get short of breath when doing very little physical activity.  You have difficulty eating, drinking, or talking due to asthma symptoms.  You develop chest pain.  You develop a fast heartbeat.  You have a bluish color to your lips or fingernails.  You are light-headed, dizzy, or  faint.  Your peak flow is less than 50% of your personal best. MAKE SURE YOU:   Understand these instructions.  Will watch your condition.  Will get help right away if you are not doing well or get worse. Document Released: 07/24/2005 Document Revised: 12/08/2013 Document Reviewed: 02/20/2013 [facility] Patient Information 2015 [place], [place]. This information is not intended to replace advice given to you by your health care provider. Make sure you discuss any questions you have with your health care provider.
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
## 2022-07-07 DEATH — deceased
